# Patient Record
Sex: Male | Born: 1940 | Hispanic: Yes | Marital: Married | Smoking: Never smoker
Health system: Southern US, Community
[De-identification: ages and names within clinical notes are randomized; demographics above are authoritative.]

---

## 2014-03-22 ENCOUNTER — Encounter (HOSPITAL_COMMUNITY): Payer: Self-pay | Admitting: *Deleted

## 2014-03-22 ENCOUNTER — Emergency Department (HOSPITAL_COMMUNITY)
Admission: EM | Admit: 2014-03-22 | Discharge: 2014-03-23 | Disposition: A | Discharge status: Home / Self Care | Payer: Medicaid Other | Attending: Emergency Medicine | Admitting: Emergency Medicine

## 2014-03-22 ENCOUNTER — Emergency Department (HOSPITAL_COMMUNITY): Payer: Medicaid Other

## 2014-03-22 DIAGNOSIS — R0789 Other chest pain: Secondary | ICD-10-CM | POA: Diagnosis not present

## 2014-03-22 DIAGNOSIS — R062 Wheezing: Secondary | ICD-10-CM | POA: Diagnosis not present

## 2014-03-22 DIAGNOSIS — R079 Chest pain, unspecified: Secondary | ICD-10-CM | POA: Diagnosis present

## 2014-03-22 LAB — BASIC METABOLIC PANEL
Anion gap: 6 (ref 5–15)
BUN: 14 mg/dL (ref 6–23)
CO2: 27 mmol/L (ref 19–32)
Calcium: 9 mg/dL (ref 8.4–10.5)
Chloride: 105 mmol/L (ref 96–112)
Creatinine, Ser: 0.98 mg/dL (ref 0.50–1.35)
GFR calc Af Amer: >90 mL/min (ref >90)
GFR calc non Af Amer: 81 mL/min — ABNORMAL LOW (ref >90)
Glucose, Bld: 127 mg/dL — ABNORMAL HIGH (ref 70–99)
Potassium: 4.1 mmol/L (ref 3.5–5.1)
Sodium: 138 mmol/L (ref 135–145)

## 2014-03-22 LAB — TROPONIN I

## 2014-03-22 LAB — CBC
HCT: 43.1 % (ref 39.0–52.0)
HEMOGLOBIN: 14.9 g/dL (ref 13.0–17.0)
MCH: 31.6 pg (ref 26.0–34.0)
MCHC: 34.6 g/dL (ref 30.0–36.0)
MCV: 91.3 fL (ref 78.0–100.0)
PLATELETS: 257 10*3/uL (ref 150–400)
RBC: 4.72 MIL/uL (ref 4.22–5.81)
RDW: 13 % (ref 11.5–15.5)
WBC: 7.9 10*3/uL (ref 4.0–10.5)

## 2014-03-22 LAB — BRAIN NATRIURETIC PEPTIDE: B NATRIURETIC PEPTIDE 5: 16.1 pg/mL (ref 0.0–100.0)

## 2014-03-22 MED ORDER — IPRATROPIUM BROMIDE 0.02 % IN SOLN
0.5000 mg | Freq: Once | RESPIRATORY_TRACT | Status: AC
  Administered 2014-03-22: 0.5 mg via RESPIRATORY_TRACT
  Filled 2014-03-22: qty 2.5

## 2014-03-22 MED ORDER — ALBUTEROL SULFATE (2.5 MG/3ML) 0.083% IN NEBU
5.0000 mg | INHALATION_SOLUTION | Freq: Once | RESPIRATORY_TRACT | Status: AC
  Administered 2014-03-22: 5 mg via RESPIRATORY_TRACT
  Filled 2014-03-22: qty 6

## 2014-03-22 NOTE — ED Notes (Signed)
The pt is c/o chest pain for 3 days.  No other problems.  He speaks little english

## 2014-03-22 NOTE — ED Notes (Signed)
[place] and he is staying here

## 2014-03-22 NOTE — ED Provider Notes (Signed)
CSN: [id number]     Arrival date & time 03/22/14  1959 History   First MD Initiated Contact with Patient 03/22/14 2240     Chief Complaint  Patient presents with  . Chest Pain      The history is provided by the patient. A language interpreter was used Psychologist, prison and probation services interpreters).   Patient reports some chest discomfort and shortness of breath over the past several days.  He states this is intermittent.  He states during the day he has no difficulties but tends to feel short of breath at night.  He tends to get up out of bed when he feels this way.  He denies significant orthopnea.  Patient recently moved from [place].  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  He has never seen a doctor before.  He has no history of COPD or asthma.  He has never had an inhaler.  He denies productive cough.  No family history of significant coronary artery disease.  No history DVT or pulmonary embolism.  No unilateral leg swelling.  No recent surgery.  Denies chest discomfort at this time but reports some feeling like he needs to take a deep breath.  History reviewed. No pertinent past medical history. History reviewed. No pertinent past surgical history. No family history on file. History  Substance Use Topics  . Smoking status: Never Smoker   . Smokeless tobacco: Not on file  . Alcohol Use: No    Review of Systems  All other systems reviewed and are negative.     Allergies  Review of patient's allergies indicates no known allergies.  Home Medications   Prior to Admission medications   Not on File   BP 147/76 mmHg  Pulse 74  Temp(Src) 98.4 F (36.9 C) (Oral)  Resp 16  Ht  (1.676 m)  Wt 170 lb (77.111 kg)  BMI 27.45 kg/m2  SpO2 98% Physical Exam  Constitutional: He is oriented to person, place, and time. He appears well-developed and well-nourished.  HENT:  Head: Normocephalic and atraumatic.  Eyes: EOM are normal.  Neck: Normal range  of motion.  Cardiovascular: Normal rate, regular rhythm, normal heart sounds and intact distal pulses.   Pulmonary/Chest: Effort normal. No respiratory distress. He has wheezes.  Abdominal: Soft. He exhibits no distension. There is no tenderness.  Musculoskeletal: Normal range of motion.  Neurological: He is alert and oriented to person, place, and time.  Skin: Skin is warm and dry.  Psychiatric: He has a normal mood and affect. Judgment normal.  Nursing note and vitals reviewed.   ED Course  Procedures (including critical care time) Labs Review Labs Reviewed  BASIC METABOLIC PANEL - Abnormal; Notable for the following:    Glucose, Bld 127 (*)    GFR calc non Af Amer 81 (*)    All other components within normal limits  CBC  BRAIN NATRIURETIC PEPTIDE  TROPONIN I  I-STAT TROPOININ, ED    Imaging Review Dg Chest 2 View  03/22/2014   CLINICAL DATA:  Left chest pain x 4 days with SOB.  Never smoker  EXAM: CHEST - 2 VIEW  COMPARISON:  None available  FINDINGS: Coarse perihilar and bibasilar interstitial markings. No confluent airspace disease. Heart size normal. No pneumothorax. No effusion. Visualized skeletal structures are unremarkable.  IMPRESSION: 1. Coarse perihilar and bibasilar interstitial markings of uncertain chronicity. No focal infiltrate.   Electronically Signed   By: Corlis Leak  M.D.   On: 03/22/2014 20:49  I personally reviewed the imaging tests through PACS system I reviewed available ER/hospitalization records through the EMR   EKG Interpretation #1 Date/Time:  Tuesday March 22 2014 20:10:20 EST Ventricular Rate:  70 PR Interval:  162 QRS Duration: 70 QT Interval:  406 QTC Calculation: 438 R Axis:   46 Text Interpretation:  Normal sinus rhythm nonspecific ST changes No old  tracing to compare Confirmed by Deyna Carbon  MD, Nishawn Rotan ([id number]) on 03/22/2014  10:44:20 PM    EKG Interpretation #2  Date/Time:  Tuesday March 22 2014 22:43:32 EST Ventricular Rate:  67 PR  Interval:  173 QRS Duration: 80 QT Interval:  422 QTC Calculation: 445 R Axis:   48 Text Interpretation:  Sinus rhythm No significant change was found Confirmed by Toneisha Savary  MD, Jacqueline Spofford ([id number]) on 03/22/2014 11:13:57 PM        MDM   Final diagnoses:  None    Patient feels much better after bronchodilators.  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  I've asked that he follow-up with the primary care physician, pulmonologist, cardiologist.  EKG normal sinus rhythm.  Troponin is negative.  Doubt ACS.  Patient understands return to the ER for new or worsening symptoms.  Home with a albuterol inhaler.    Lyanne CoKevin M Kalan Rinn, MD 03/23/14 [phone]

## 2014-03-23 LAB — I-STAT TROPONIN, ED: Troponin i, poc: 0 ng/mL (ref 0.00–0.08)

## 2014-03-23 MED ORDER — ALBUTEROL SULFATE HFA 108 (90 BASE) MCG/ACT IN AERS
INHALATION_SPRAY | RESPIRATORY_TRACT | Status: AC
  Filled 2014-03-23: qty 6.7

## 2016-01-04 IMAGING — CR DG CHEST 2V
2 series · 2 of 2 positions shown · non-contrast
Comparison: None available

CLINICAL DATA: Left chest pain x 4 days with SOB.  Never smoker

EXAM:
CHEST - 2 VIEW

[chest pa]
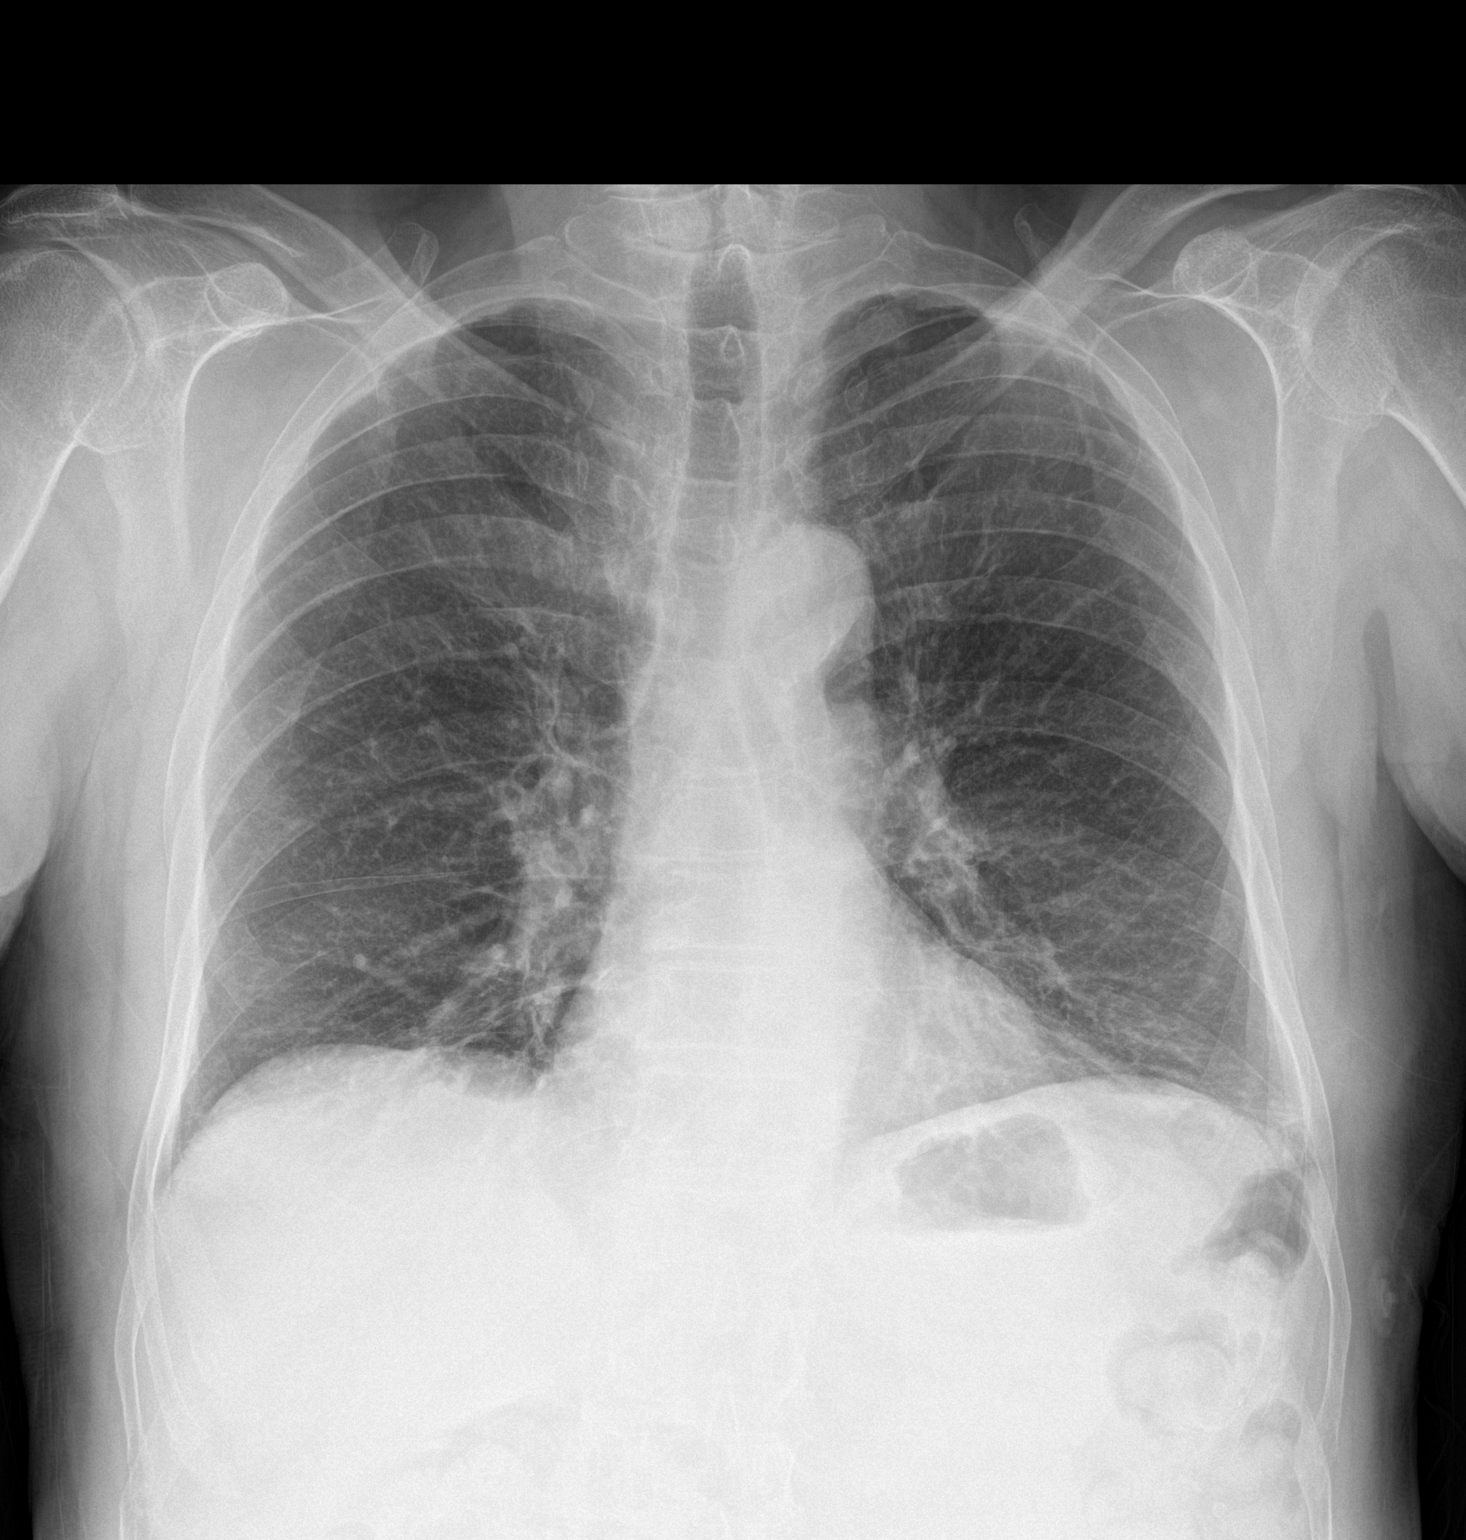

[chest lat]
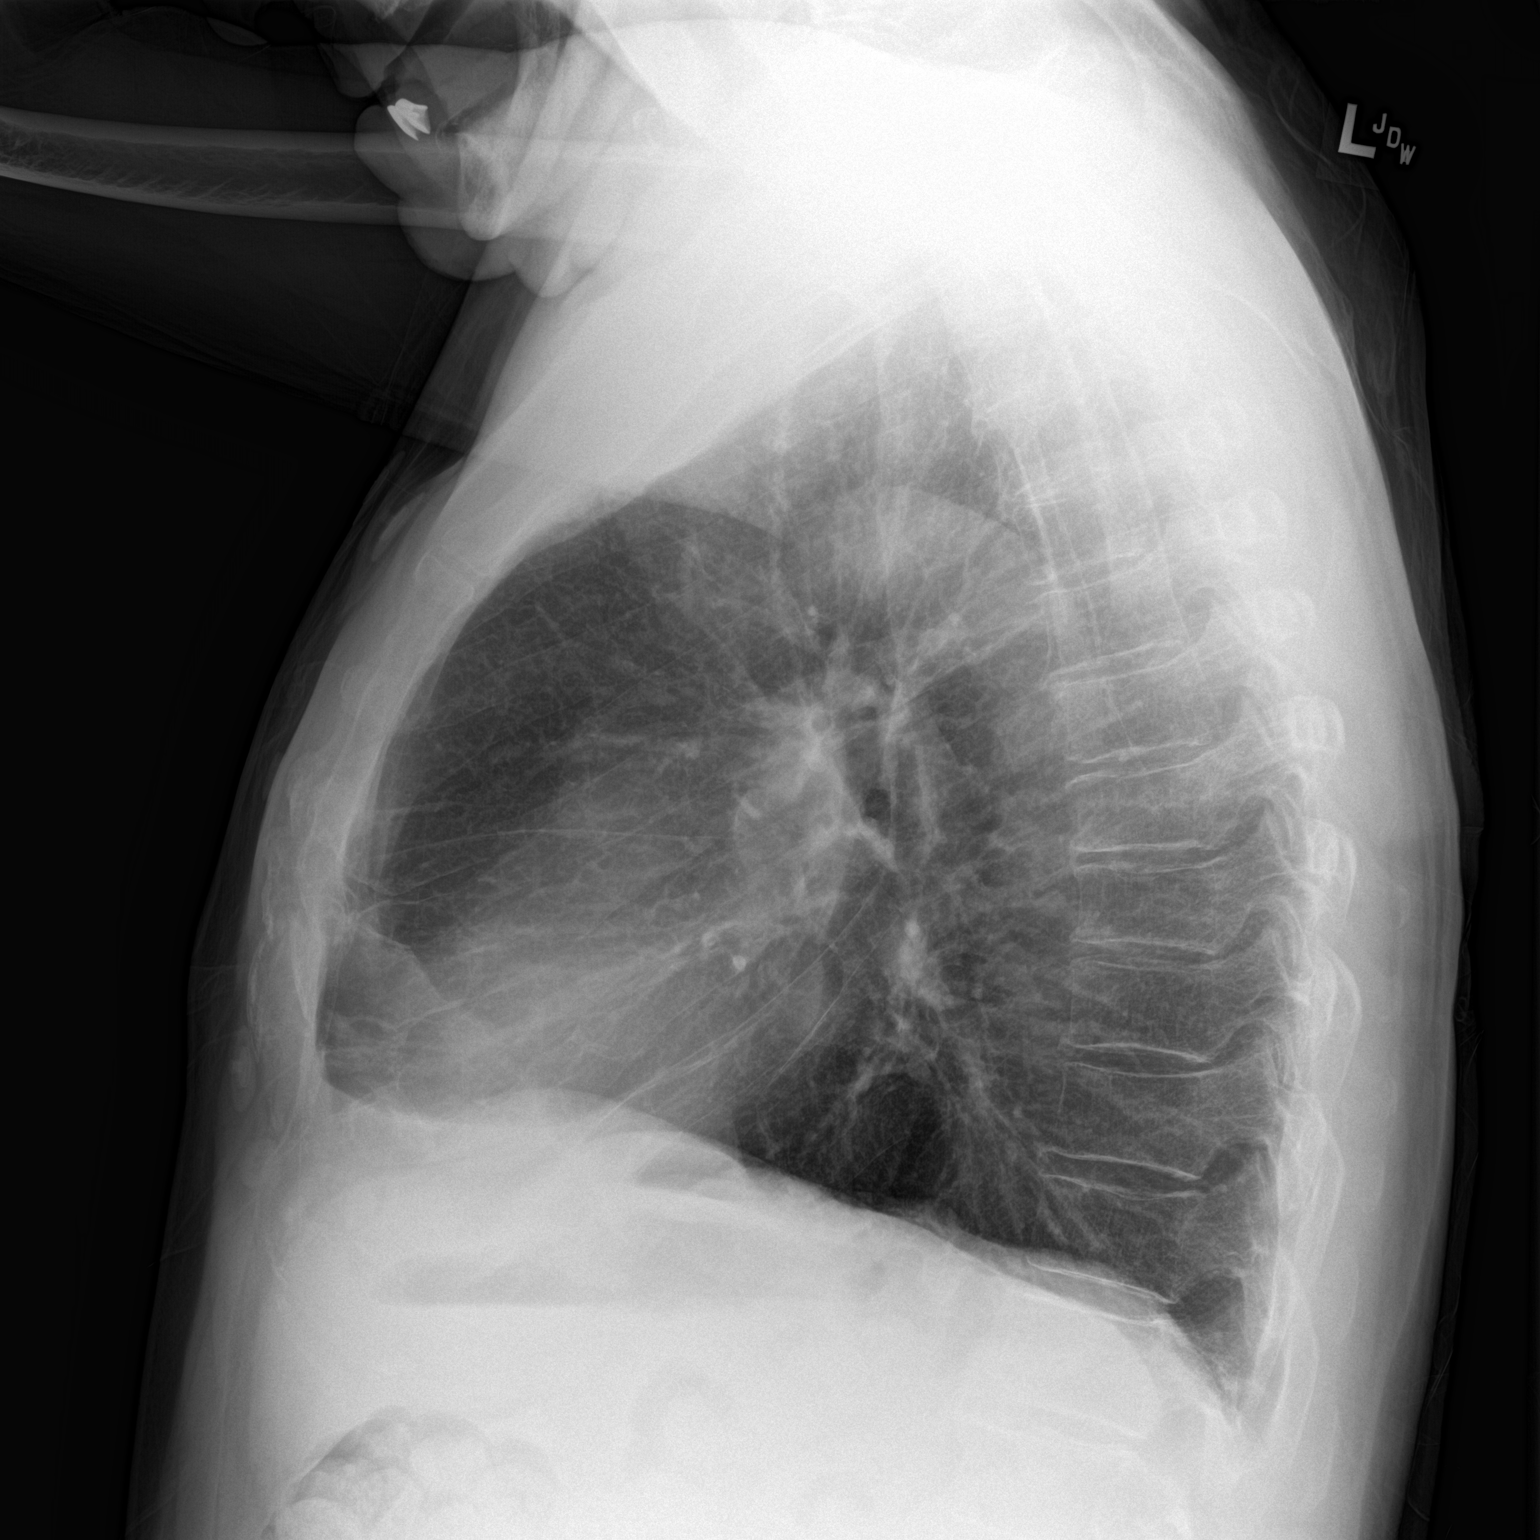

[2 of 2 positions shown; findings below may reference images not displayed]

FINDINGS: Coarse perihilar and bibasilar interstitial markings. No confluent
airspace disease. Heart size normal. No pneumothorax.
No effusion.
Visualized skeletal structures are unremarkable.
IMPRESSION: 1. Coarse perihilar and bibasilar interstitial markings of uncertain
chronicity. No focal infiltrate.

## 2021-01-19 ENCOUNTER — Emergency Department (HOSPITAL_COMMUNITY): Payer: Medicaid Other

## 2021-01-19 ENCOUNTER — Other Ambulatory Visit: Payer: Self-pay

## 2021-01-19 ENCOUNTER — Emergency Department (HOSPITAL_COMMUNITY)
Admission: EM | Admit: 2021-01-19 | Discharge: 2021-01-20 | Disposition: A | Discharge status: Home / Self Care | Payer: Medicaid Other | Attending: Emergency Medicine | Admitting: Emergency Medicine

## 2021-01-19 ENCOUNTER — Encounter (HOSPITAL_COMMUNITY): Payer: Self-pay | Admitting: Emergency Medicine

## 2021-01-19 DIAGNOSIS — F03B0 Unspecified dementia, moderate, without behavioral disturbance, psychotic disturbance, mood disturbance, and anxiety: Secondary | ICD-10-CM | POA: Diagnosis not present

## 2021-01-19 DIAGNOSIS — Z20822 Contact with and (suspected) exposure to covid-19: Secondary | ICD-10-CM | POA: Diagnosis not present

## 2021-01-19 DIAGNOSIS — R4182 Altered mental status, unspecified: Secondary | ICD-10-CM | POA: Diagnosis present

## 2021-01-19 LAB — CBC WITH DIFFERENTIAL/PLATELET
Abs Immature Granulocytes: 0.04 10*3/uL (ref 0.00–0.07)
Basophils Absolute: 0 10*3/uL (ref 0.0–0.1)
Basophils Relative: 1 %
Eosinophils Absolute: 0.1 10*3/uL (ref 0.0–0.5)
Eosinophils Relative: 2 %
HCT: 42.9 % (ref 39.0–52.0)
Hemoglobin: 13.7 g/dL (ref 13.0–17.0)
Immature Granulocytes: 1 %
Lymphocytes Relative: 37 %
Lymphs Abs: 3 10*3/uL (ref 0.7–4.0)
MCH: 29.8 pg (ref 26.0–34.0)
MCHC: 31.9 g/dL (ref 30.0–36.0)
MCV: 93.5 fL (ref 80.0–100.0)
Monocytes Absolute: 0.7 10*3/uL (ref 0.1–1.0)
Monocytes Relative: 8 %
Neutro Abs: 4.2 10*3/uL (ref 1.7–7.7)
Neutrophils Relative %: 51 %
Platelets: 287 10*3/uL (ref 150–400)
RBC: 4.59 MIL/uL (ref 4.22–5.81)
RDW: 13.4 % (ref 11.5–15.5)
WBC: 8.1 10*3/uL (ref 4.0–10.5)
nRBC: 0 % (ref 0.0–0.2)

## 2021-01-19 LAB — URINALYSIS, ROUTINE W REFLEX MICROSCOPIC
Bilirubin Urine: NEGATIVE
Glucose, UA: NEGATIVE mg/dL
Hgb urine dipstick: NEGATIVE
Ketones, ur: NEGATIVE mg/dL
Leukocytes,Ua: NEGATIVE
Nitrite: NEGATIVE
Protein, ur: NEGATIVE mg/dL
Specific Gravity, Urine: 1.008 (ref 1.005–1.030)
pH: 5 (ref 5.0–8.0)

## 2021-01-19 LAB — COMPREHENSIVE METABOLIC PANEL
ALT: 29 U/L (ref 0–44)
AST: 25 U/L (ref 15–41)
Albumin: 3.6 g/dL (ref 3.5–5.0)
Alkaline Phosphatase: 61 U/L (ref 38–126)
Anion gap: 7 (ref 5–15)
BUN: 12 mg/dL (ref 8–23)
CO2: 28 mmol/L (ref 22–32)
Calcium: 9 mg/dL (ref 8.9–10.3)
Chloride: 103 mmol/L (ref 98–111)
Creatinine, Ser: 1.16 mg/dL (ref 0.61–1.24)
GFR, Estimated: >60 mL/min (ref >60)
Glucose, Bld: 105 mg/dL — ABNORMAL HIGH (ref 70–99)
Potassium: 4.5 mmol/L (ref 3.5–5.1)
Sodium: 138 mmol/L (ref 135–145)
Total Bilirubin: 0.6 mg/dL (ref 0.3–1.2)
Total Protein: 7.1 g/dL (ref 6.5–8.1)

## 2021-01-19 NOTE — ED Triage Notes (Signed)
Pt here w/ son for AMS, pt is disoriented x4, per son pt started having some confusion 6 months ago but has progressively been getting worse. Son denies any falls, sicknesses.

## 2021-01-20 LAB — RESP PANEL BY RT-PCR (FLU A&B, COVID) ARPGX2
Influenza A by PCR: NEGATIVE
Influenza B by PCR: NEGATIVE
SARS Coronavirus 2 by RT PCR: NEGATIVE

## 2021-01-20 MED ORDER — PAROXETINE HCL 20 MG PO TABS: 20.0000 mg | ORAL_TABLET | Freq: Every day | ORAL | 3 refills | Status: AC

## 2021-01-20 MED ORDER — MEMANTINE HCL 10 MG PO TABS: 10.0000 mg | ORAL_TABLET | Freq: Two times a day (BID) | ORAL | 3 refills | Status: AC

## 2021-01-20 NOTE — ED Provider Notes (Signed)
[facility] EMERGENCY DEPARTMENT Provider Note   CSN: [id number] Arrival date & time: 01/19/21  1801     History Chief Complaint  Patient presents with   Altered Mental Status    Brett Meyer is a 80 y.o. male.  Pt is a 80 year old male with a history of memory problems who is presenting today with his son due to worsening confusion, difficulty doing his normal tasks throughout the day and its progressively getting worse.  Son reports that this has been ongoing for at least 8 or 9 months but when he was in [place] he was given some medications and had an exam and was told that his brain was shrinking.  He ran out of those medications 3 weeks ago and does not have a doctor in this area that can prescribe them.  He otherwise has been eating and drinking just a little less than normal.  He has not had any falls.  He is still mentating and communicating but his son reports that he cannot hear very well.  Patient says for years he has not been able to hear and so he can never really tell what people are saying to him which also makes him very frustrated.  He takes no over-the-counter medications.  He has had no fever, cough, chest pain, shortness of breath, abdominal pain or change in his bowel or bladder habits.  The history is provided by the patient and a caregiver. The history is limited by a language barrier. A language interpreter was used.  Altered Mental Status     History reviewed. No pertinent past medical history.  There are no problems to display for this patient.   History reviewed. No pertinent surgical history.     History reviewed. No pertinent family history.  Social History   Tobacco Use   Smoking status: Never  Substance Use Topics   Alcohol use: No    Home Medications Prior to Admission medications   Not on File    Allergies    Patient has no known allergies.  Review of Systems   Review of Systems  All other systems reviewed  and are negative.  Physical Exam Updated Vital Signs BP (!) 142/74   Pulse 65   Temp 97.9 F (36.6 C)   Resp 16   SpO2 95%   Physical Exam Vitals and nursing note reviewed.  Constitutional:      General: He is not in acute distress.    Appearance: He is well-developed.  HENT:     Head: Normocephalic and atraumatic.     Right Ear: Tympanic membrane normal.     Left Ear: Tympanic membrane normal.  Eyes:     Conjunctiva/sclera: Conjunctivae normal.     Pupils: Pupils are equal, round, and reactive to light.  Cardiovascular:     Rate and Rhythm: Normal rate and regular rhythm.     Heart sounds: No murmur heard. Pulmonary:     Effort: Pulmonary effort is normal. No respiratory distress.     Breath sounds: Normal breath sounds. No wheezing or rales.  Abdominal:     General: There is no distension.     Palpations: Abdomen is soft.     Tenderness: There is no abdominal tenderness. There is no guarding or rebound.  Musculoskeletal:        General: No tenderness. Normal range of motion.     Cervical back: Normal range of motion and neck supple.  Skin:    General: Skin is  warm and dry.     Findings: No erythema or rash.  Neurological:     Mental Status: He is alert. Mental status is at baseline.     Sensory: No sensory deficit.     Motor: No weakness.     Coordination: Coordination normal.     Gait: Gait normal.     Comments: Oriented to person and place.  Able to participate in the exam.  Psychiatric:        Behavior: Behavior normal.     Comments: Calm and cooperative    ED Results / Procedures / Treatments   Labs (all labs ordered are listed, but only abnormal results are displayed) Labs Reviewed  COMPREHENSIVE METABOLIC PANEL - Abnormal; Notable for the following components:      Result Value   Glucose, Bld 105 (*)    All other components within normal limits  URINALYSIS, ROUTINE W REFLEX MICROSCOPIC - Abnormal; Notable for the following components:   Color, Urine  STRAW (*)    All other components within normal limits  RESP PANEL BY RT-PCR (FLU A&B, COVID) ARPGX2  CBC WITH DIFFERENTIAL/PLATELET    EKG None  Radiology CT Head Wo Contrast  Result Date: 01/19/2021 CLINICAL DATA:  Altered level of consciousness, confusion EXAM: CT HEAD WITHOUT CONTRAST TECHNIQUE: Contiguous axial images were obtained from the base of the skull through the vertex without intravenous contrast. COMPARISON:  None. FINDINGS: Brain: No acute infarct or hemorrhage. Lateral ventricles and midline structures are unremarkable. No acute extra-axial fluid collections. No mass effect. Vascular: No hyperdense vessel or unexpected calcification. Skull: Normal. Negative for fracture or focal lesion. Sinuses/Orbits: No acute finding. Other: None. IMPRESSION: 1. No acute intracranial process. Electronically Signed   By: Sharlet Salina M.D.   On: 01/19/2021 22:09    Procedures Procedures   Medications Ordered in ED Medications - No data to display  ED Course  I have reviewed the triage vital signs and the nursing notes.  Pertinent labs & imaging results that were available during my care of the patient were reviewed by me and considered in my medical decision making (see chart for details).    MDM Rules/Calculators/A&P                           Elderly male with what sounds like a history of dementia who is now been off medication for the last 3 weeks who is having decline in his mental status and daily activity.  Patient is now living in [place] with his son and does not have a PCP in this area.  He does not appear to have an acute infectious process or an acute issue at this time but more chronic ongoing problems.  This is most likely exacerbated by the fact that he ran out of his medication which was paroxetine and Namenda.  We will give patient refill of these prescriptions while case management is getting him an appointment with a PCP.  He is otherwise well-appearing and  stable for discharge.  MDM   Amount and/or Complexity of Data Reviewed Clinical lab tests: ordered and reviewed Tests in the radiology section of CPT: ordered and reviewed Independent visualization of images, tracings, or specimens: yes    Final Clinical Impression(s) / ED Diagnoses Final diagnoses:  Moderate dementia without behavioral disturbance, psychotic disturbance, mood disturbance, or anxiety, unspecified dementia type    Rx / DC Orders ED Discharge Orders  Ordered    PARoxetine (PAXIL) 20 MG tablet  Daily        01/20/21 0848    memantine (NAMENDA) 10 MG tablet  2 times daily        01/20/21 0848             Gwyneth Sprout, MD 01/20/21 [phone]

## 2021-01-20 NOTE — Discharge Instructions (Signed)
All the blood work and x-rays look good today.  His prescriptions went to the [facility].  You can call the office on Monday and they can get to a doctor's appointment there will be a Spanish speaker when you call.  They can also help him get hearing aids at that time.
